# Patient Record
Sex: Male | Born: 1965 | Hispanic: No | Marital: Married | Smoking: Never smoker
Health system: Southern US, Community
[De-identification: ages and names within clinical notes are randomized; demographics above are authoritative.]

## PROBLEM LIST (undated history)

## (undated) DIAGNOSIS — I10 Essential (primary) hypertension: Secondary | ICD-10-CM

---

## 2016-03-26 ENCOUNTER — Emergency Department (HOSPITAL_COMMUNITY): Payer: BLUE CROSS/BLUE SHIELD

## 2016-03-26 ENCOUNTER — Other Ambulatory Visit: Payer: Self-pay

## 2016-03-26 ENCOUNTER — Emergency Department (HOSPITAL_COMMUNITY)
Admission: EM | Admit: 2016-03-26 | Discharge: 2016-03-26 | Disposition: A | Discharge status: Home / Self Care | Payer: BLUE CROSS/BLUE SHIELD | Attending: Emergency Medicine | Admitting: Emergency Medicine

## 2016-03-26 ENCOUNTER — Encounter (HOSPITAL_COMMUNITY): Payer: Self-pay

## 2016-03-26 DIAGNOSIS — R079 Chest pain, unspecified: Secondary | ICD-10-CM | POA: Diagnosis present

## 2016-03-26 DIAGNOSIS — I10 Essential (primary) hypertension: Secondary | ICD-10-CM | POA: Insufficient documentation

## 2016-03-26 DIAGNOSIS — Z7982 Long term (current) use of aspirin: Secondary | ICD-10-CM | POA: Diagnosis not present

## 2016-03-26 HISTORY — DX: Essential (primary) hypertension: I10

## 2016-03-26 LAB — CBC
HCT: 43.6 % (ref 39.0–52.0)
Hemoglobin: 14.3 g/dL (ref 13.0–17.0)
MCH: 30.2 pg (ref 26.0–34.0)
MCHC: 32.8 g/dL (ref 30.0–36.0)
MCV: 92 fL (ref 78.0–100.0)
Platelets: 224 10*3/uL (ref 150–400)
RBC: 4.74 MIL/uL (ref 4.22–5.81)
RDW: 13.7 % (ref 11.5–15.5)
WBC: 5.8 10*3/uL (ref 4.0–10.5)

## 2016-03-26 LAB — I-STAT TROPONIN, ED
TROPONIN I, POC: 0 ng/mL (ref 0.00–0.08)
Troponin i, poc: 0 ng/mL (ref 0.00–0.08)

## 2016-03-26 LAB — BASIC METABOLIC PANEL
Anion gap: 8 (ref 5–15)
BUN: 14 mg/dL (ref 6–20)
CALCIUM: 9.3 mg/dL (ref 8.9–10.3)
CO2: 24 mmol/L (ref 22–32)
CREATININE: 1.07 mg/dL (ref 0.61–1.24)
Chloride: 107 mmol/L (ref 101–111)
GFR calc Af Amer: >60 mL/min (ref >60)
GLUCOSE: 102 mg/dL — AB (ref 65–99)
Potassium: 3.9 mmol/L (ref 3.5–5.1)
Sodium: 139 mmol/L (ref 135–145)

## 2016-03-26 LAB — D-DIMER, QUANTITATIVE: D-Dimer, Quant: <0.27 ug/mL-FEU (ref 0.00–0.50)

## 2016-03-26 MED ORDER — HYDROCHLOROTHIAZIDE 12.5 MG PO CAPS: 12.5000 mg | ORAL_CAPSULE | Freq: Every day | ORAL | Status: AC

## 2016-03-26 MED ORDER — ASPIRIN 81 MG PO CHEW
324.0000 mg | CHEWABLE_TABLET | Freq: Once | ORAL | Status: AC
  Administered 2016-03-26: 324 mg via ORAL
  Filled 2016-03-26: qty 4

## 2016-03-26 MED ORDER — HYDROCHLOROTHIAZIDE 12.5 MG PO CAPS
12.5000 mg | ORAL_CAPSULE | Freq: Every day | ORAL | Status: DC
  Administered 2016-03-26: 12.5 mg via ORAL
  Filled 2016-03-26: qty 1

## 2016-03-26 NOTE — ED Provider Notes (Signed)
CSN: [id number]     Arrival date & time 03/26/16  9147 History   First MD Initiated Contact with Patient 03/26/16 [phone]     Chief Complaint  Patient presents with  . Chest Pain     (Consider location/radiation/quality/duration/timing/severity/associated sxs/prior Treatment) The history is provided by the patient.  50 year old the male with a complaint of left lateral chest pain intermittent since 4:00 this morning. Last only a few seconds associated with shortness of breath no nausea no vomiting. Not made worse by taking a deep breath not made worse by moving the left arm. No history of similar pain. No history of injury. Patient has a history of hypertension but has been off medications for approximately 5 years. Patient denies any abdominal pain no nausea vomiting no headaches. No fevers.  Past Medical History  Diagnosis Date  . Hypertension    History reviewed. No pertinent past surgical history. No family history on file. Social History  Substance Use Topics  . Smoking status: Never Smoker   . Smokeless tobacco: None  . Alcohol Use: None    Review of Systems  Constitutional: Negative for fever.  HENT: Negative for congestion.   Eyes: Negative for visual disturbance.  Respiratory: Positive for shortness of breath.   Cardiovascular: Positive for chest pain. Negative for leg swelling.  Gastrointestinal: Negative for abdominal pain.  Genitourinary: Negative for dysuria.  Musculoskeletal: Negative for back pain.  Skin: Negative for rash.  Neurological: Negative for headaches.  Hematological: Does not bruise/bleed easily.  Psychiatric/Behavioral: Negative for confusion.      Allergies  Review of patient's allergies indicates no known allergies.  Home Medications   Prior to Admission medications   Medication Sig Start Date End Date Taking? Authorizing Provider  aspirin 325 MG tablet Take 650 mg by mouth daily as needed for mild pain.   Yes Historical Provider, MD   BP  163/117 mmHg  Pulse 71  Temp(Src) 98.4 F (36.9 C) (Oral)  Resp 22  Ht  (1.727 m)  Wt 104.327 kg  BMI 34.98 kg/m2  SpO2 98% Physical Exam  Constitutional: He is oriented to person, place, and time. He appears well-developed and well-nourished. No distress.  HENT:  Head: Normocephalic and atraumatic.  Mouth/Throat: Oropharynx is clear and moist.  Eyes: Conjunctivae and EOM are normal. Pupils are equal, round, and reactive to light.  Neck: Normal range of motion. Neck supple.  Cardiovascular: Normal rate, regular rhythm and normal heart sounds.   No murmur heard. Pulmonary/Chest: Effort normal and breath sounds normal. No respiratory distress.  Abdominal: Soft. Bowel sounds are normal. There is no tenderness.  Musculoskeletal: Normal range of motion. He exhibits no edema.  Neurological: He is alert and oriented to person, place, and time. No cranial nerve deficit. He exhibits normal muscle tone. Coordination normal.  Skin: Skin is warm. No rash noted.  Nursing note and vitals reviewed.   ED Course  Procedures (including critical care time) Labs Review Labs Reviewed  BASIC METABOLIC PANEL - Abnormal; Notable for the following:    Glucose, Bld 102 (*)    All other components within normal limits  CBC  D-DIMER, QUANTITATIVE (NOT AT [facility])  Rosezena Sensor, ED  Rosezena Sensor, ED    Imaging Review Dg Chest 2 View  03/26/2016  CLINICAL DATA:  Patient with left-sided chest pain. EXAM: CHEST  2 VIEW COMPARISON:  None. FINDINGS: Cardiac contours upper limits of normal. Mild tortuosity of the thoracic aorta. No consolidative pulmonary opacities. No pleural effusion or  pneumothorax. Regional skeleton is unremarkable. IMPRESSION: No active cardiopulmonary disease. Electronically Signed   By: Annia Beltrew  Davis M.D.   On: 03/26/2016 10:15   I have personally reviewed and evaluated these images and lab results as part of my medical decision-making.   EKG Interpretation   Date/Time:   Sunday March 26 2016 09:33:48 EDT Ventricular Rate:  80 PR Interval:  154 QRS Duration: 106 QT Interval:  388 QTC Calculation: 447 R Axis:   59 Text Interpretation:  Normal sinus rhythm Left ventricular hypertrophy  with repolarization abnormality Abnormal ECG No previous ECGs available  Confirmed by Darshana Curnutt  MD, Davonta Stroot [phone]([id number]) on 03/26/2016 10:28:35 AM       ED ECG REPORT   Date: 03/26/2016  Rate: 80  Rhythm: normal sinus rhythm  QRS Axis: normal  Intervals: normal  ST/T Wave abnormalities: normal  Conduction Disutrbances:none  Narrative Interpretation:   Old EKG Reviewed: none available  I have personally reviewed the EKG tracing and agree with the computerized printout as noted.\ Evidence of left ventricular hypertrophy with repolarization abnormality. EKG did not crossover from MUSE.   MDM   Final diagnoses:  Chest pain, unspecified chest pain type   Patient's workup for the intermittent chest pain left lateral chest without any acute findings. Troponins 2 were negative. Patient never had chest pain lasting more than just a few minutes certainly less than 5 minutes. D-dimer also negative unlikely to be pulmonary embolus no hypoxia. Chest x-rays negative for pneumonia pneumothorax or pulmonary edema. EKG no old one for comparison but did have some early repolarization type changes.  Patient will be referred to cardiology for follow-up. And started on a baby aspirin a day.  Patient also with hypertension here. Given HCTZ. Patient states he was on some type of hypertensive medication years in the past. But none for several years. Will restart him on hydrochlorothiazide 12.5 mg daily.  Vanetta MuldersScott Wrenley Sayed, MD 03/26/16 1359

## 2016-03-26 NOTE — ED Notes (Signed)
Pt ambulates independently and with steady gait at time of discharge. Discharge instructions and follow up information reviewed with patient. No other questions or concerns voiced at this time.  

## 2016-03-26 NOTE — Discharge Instructions (Signed)
Aspirin and Your Heart  Aspirin is a medicine that affects the way blood clots. Aspirin can be used to help reduce the risk of blood clots, heart attacks, and other heart-related problems.  SHOULD I TAKE ASPIRIN? Your health care provider will help you determine whether it is safe and beneficial for you to take aspirin daily. Taking aspirin daily may be beneficial if you:  Have had a heart attack or chest pain.  Have undergone open heart surgery such as coronary artery bypass surgery (CABG).  Have had coronary angioplasty.  Have experienced a stroke or transient ischemic attack (TIA).  Have peripheral vascular disease (PVD).  Have chronic heart rhythm problems such as atrial fibrillation. ARE THERE ANY RISKS OF TAKING ASPIRIN DAILY? Daily use of aspirin can increase your risk of side effects. Some of these include:  Bleeding. Bleeding problems can be minor or serious. An example of a minor problem is a cut that does not stop bleeding. An example of a more serious problem is stomach bleeding or bleeding into the brain. Your risk of bleeding is increased if you are also taking non-steroidal anti-inflammatory medicine (NSAIDs).  Increased bruising.  Upset stomach.  An allergic reaction. People who have nasal polyps have an increased risk of developing an aspirin allergy. WHAT ARE SOME GUIDELINES I SHOULD FOLLOW WHEN TAKING ASPIRIN?   Take aspirin only as directed by your health care provider. Make sure you understand how much you should take and what form you should take. The two forms of aspirin are:  Non-enteric-coated. This type of aspirin does not have a coating and is absorbed quickly. Non-enteric-coated aspirin is usually recommended for people with chest pain. This type of aspirin also comes in a chewable form.  Enteric-coated. This type of aspirin has a special coating that releases the medicine very slowly. Enteric-coated aspirin causes less stomach upset than non-enteric-coated  aspirin. This type of aspirin should not be chewed or crushed.  Drink alcohol in moderation. Drinking alcohol increases your risk of bleeding. WHEN SHOULD I SEEK MEDICAL CARE?   You have unusual bleeding or bruising.  You have stomach pain.  You have an allergic reaction. Symptoms of an allergic reaction include:  Hives.  Itchy skin.  Swelling of the lips, tongue, or face.  You have ringing in your ears. WHEN SHOULD I SEEK IMMEDIATE MEDICAL CARE?   Your bowel movements are bloody, dark red, or black in color.  You vomit or cough up blood.  You have blood in your urine.  You cough, wheeze, or feel short of breath. If you have any of the following symptoms, this is an emergency. Do not wait to see if the pain will go away. Get medical help at once. Call your local emergency services (911 in the [place] Do not drive yourself to the hospital.  You have severe chest pain, especially if the pain is crushing or pressure-like and spreads to the arms, back, neck, or jaw.  You have stroke-like symptoms, such as:   Loss of vision.   Difficulty talking.   Numbness or weakness on one side of your body.   Numbness or weakness in your arm or leg.   Not thinking clearly or feeling confused.    This information is not intended to replace advice given to you by your health care provider. Make sure you discuss any questions you have with your health care provider.  Workup negative. Recommend starting a baby aspirin a day. Make appointment to follow-up with cardiology. Return  for any chest pain that starts lasting 15 minutes or longer.   Document Released: 09/07/2008 Document Revised: 10/16/2014 Document Reviewed: 12/31/2013 Elsevier Interactive Patient Education [employer]

## 2016-03-26 NOTE — ED Notes (Signed)
Patient transported to X-ray 

## 2016-03-26 NOTE — ED Notes (Signed)
Patient developed intermittent left sided chest pain since 0330, awoke him from sleep with shortness of breath. No pain on arrival. Describes as sharp pain

## 2016-03-27 ENCOUNTER — Telehealth: Payer: Self-pay | Admitting: *Deleted

## 2016-03-27 NOTE — Telephone Encounter (Signed)
Pt called stating [facility] as instructed to make follow-up appointment but was told that they did not have any appointments until August.  [facility] [employer] to see if he could get an appointment with them sooner.

## 2018-02-03 IMAGING — CR DG CHEST 2V
2 series · 2 of 2 positions shown · non-contrast
Comparison: None.

CLINICAL DATA: Patient with left-sided chest pain.

EXAM:
CHEST  2 VIEW

[chest pa]
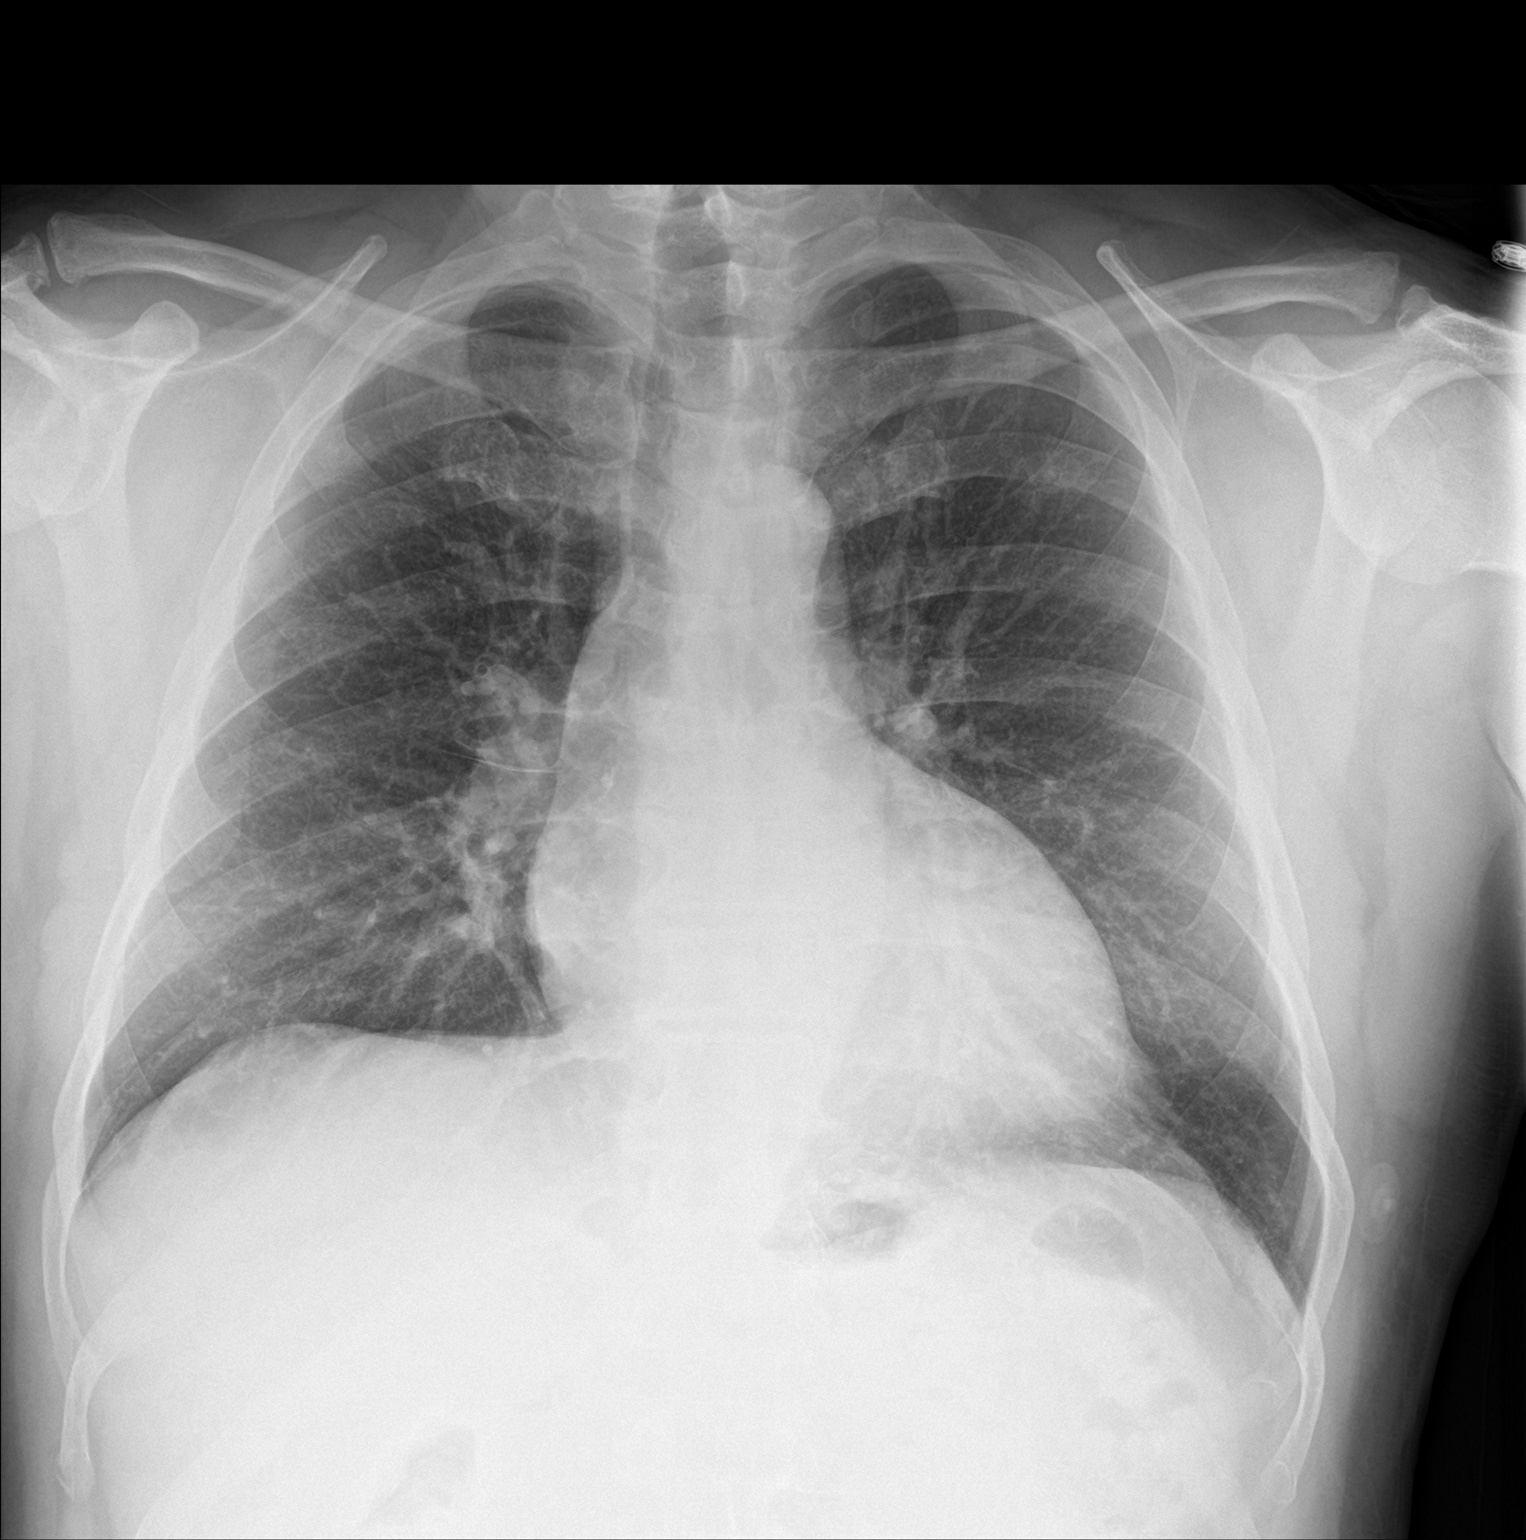

[chest lat]
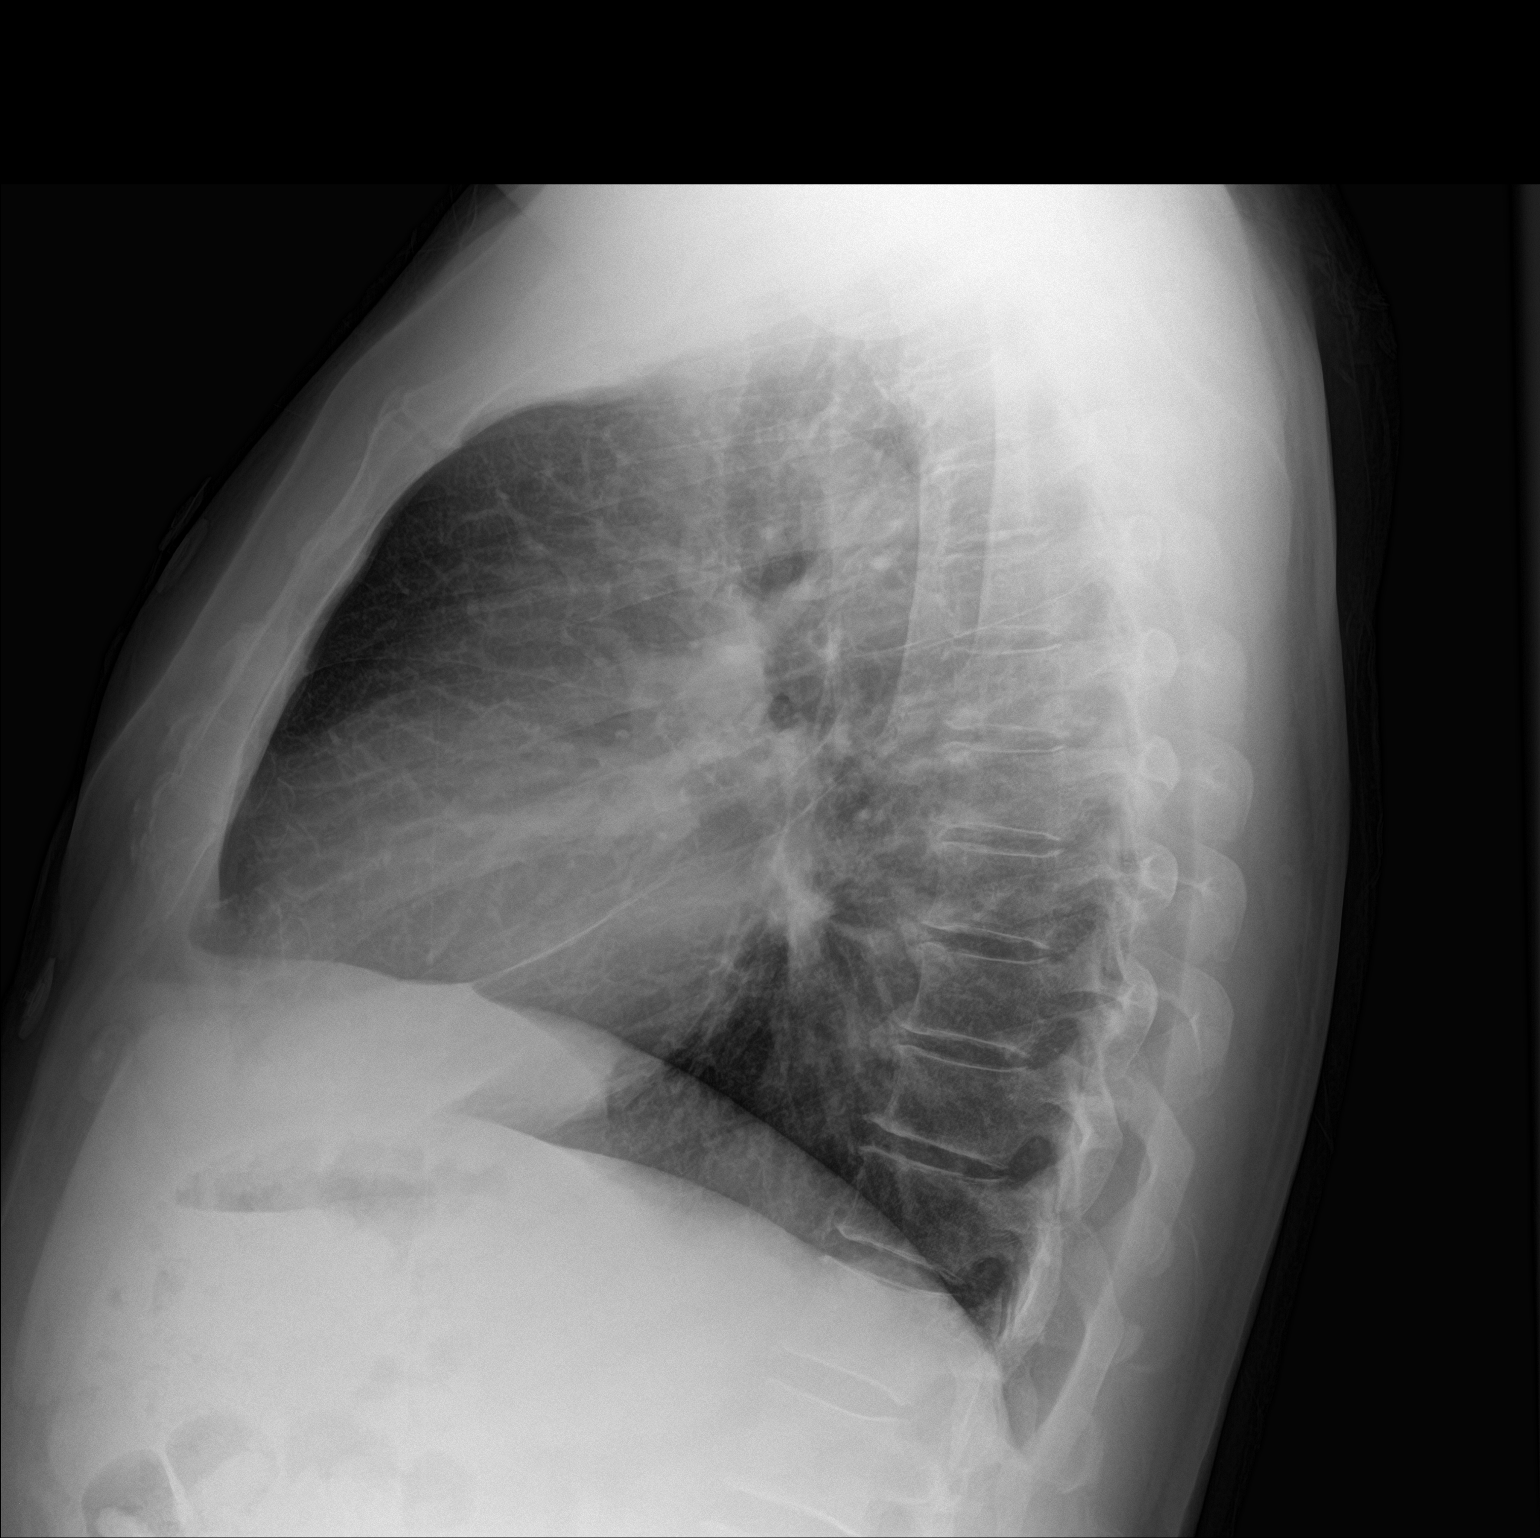

[2 of 2 positions shown; findings below may reference images not displayed]

FINDINGS: Cardiac contours upper limits of normal. Mild tortuosity of the
thoracic aorta. No consolidative pulmonary opacities. No pleural
effusion or pneumothorax. Regional skeleton is unremarkable.
IMPRESSION: No active cardiopulmonary disease.
